# Patient Record
Sex: Male | Born: 1990 | Race: White | Hispanic: Yes | Marital: Single | Smoking: Never smoker
Health system: Southern US, Community
[De-identification: ages and names within clinical notes are randomized; demographics above are authoritative.]

---
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
## 2010-09-15 NOTE — Procedures (Signed)
DUPLEX DEEP VENOUS EXAM - LOWER EXTREMITY   INDICATION:  Right lower extremity calf swelling.   HISTORY:  Edema:  Right medial calf.  Trauma/Surgery:  Injury to right medial calf in soccer approximately 3  months ago.  Pain:  No.  PE:  No.  Previous DVT:  No.  Anticoagulants:  No.  Other:   DUPLEX EXAM:                CFV   SFV   PopV  PTV    GSV                R  L  R  L  R  L  R   L  R  L  Thrombosis    o  o  o     o     o      o  Spontaneous   +  +  +     +     +      +  Phasic        +  +  +     +     +      +  Augmentation  +  +  +     +     +      +  Compressible  +  +  +     +     +      +  Competent     +  +  +     +     +      +   Legend:  + - yes  o - no  p - partial  D - decreased    IMPRESSION:  1. No evidence of deep or superficial venous thrombosis in either the      right lower extremity or the left common femoral vein.  2. Evidence of nonvascularized heterogeneous structure measuring 0.88      cm x 2.17 cm noted in the right medial calf.        _____________________________  V. Charlena Cross, MD   AS/MEDQ  D:  12/12/2008  T:  12/12/2008  Job:  [id number]

## 2010-10-06 ENCOUNTER — Emergency Department (HOSPITAL_COMMUNITY)
Admission: EM | Admit: 2010-10-06 | Discharge: 2010-10-06 | Disposition: A | Discharge status: Home / Self Care | Payer: Self-pay | Attending: Emergency Medicine | Admitting: Emergency Medicine

## 2010-10-06 ENCOUNTER — Emergency Department (HOSPITAL_COMMUNITY): Payer: Self-pay

## 2010-10-06 DIAGNOSIS — R071 Chest pain on breathing: Secondary | ICD-10-CM | POA: Insufficient documentation

## 2010-10-06 DIAGNOSIS — Y93B3 Activity, free weights: Secondary | ICD-10-CM | POA: Insufficient documentation

## 2010-10-06 DIAGNOSIS — X503XXA Overexertion from repetitive movements, initial encounter: Secondary | ICD-10-CM | POA: Insufficient documentation

## 2014-04-21 ENCOUNTER — Emergency Department (HOSPITAL_COMMUNITY): Payer: Medicaid Other

## 2014-04-21 ENCOUNTER — Emergency Department (HOSPITAL_COMMUNITY)
Admission: EM | Admit: 2014-04-21 | Discharge: 2014-04-21 | Disposition: A | Discharge status: Home / Self Care | Payer: Medicaid Other | Attending: Emergency Medicine | Admitting: Emergency Medicine

## 2014-04-21 ENCOUNTER — Encounter (HOSPITAL_COMMUNITY): Payer: Self-pay | Admitting: Emergency Medicine

## 2014-04-21 DIAGNOSIS — R0789 Other chest pain: Secondary | ICD-10-CM

## 2014-04-21 DIAGNOSIS — Z79899 Other long term (current) drug therapy: Secondary | ICD-10-CM | POA: Insufficient documentation

## 2014-04-21 DIAGNOSIS — R079 Chest pain, unspecified: Secondary | ICD-10-CM

## 2014-04-21 LAB — CBC
HCT: 46.4 % (ref 39.0–52.0)
Hemoglobin: 15.4 g/dL (ref 13.0–17.0)
MCH: 29.3 pg (ref 26.0–34.0)
MCHC: 33.2 g/dL (ref 30.0–36.0)
MCV: 88.4 fL (ref 78.0–100.0)
Platelets: 272 10*3/uL (ref 150–400)
RBC: 5.25 MIL/uL (ref 4.22–5.81)
RDW: 13.1 % (ref 11.5–15.5)
WBC: 7.6 10*3/uL (ref 4.0–10.5)

## 2014-04-21 LAB — BASIC METABOLIC PANEL
ANION GAP: 13 (ref 5–15)
BUN: 13 mg/dL (ref 6–23)
CALCIUM: 9.6 mg/dL (ref 8.4–10.5)
CO2: 26 meq/L (ref 19–32)
CREATININE: 0.93 mg/dL (ref 0.50–1.35)
Chloride: 101 mEq/L (ref 96–112)
GFR calc non Af Amer: >90 mL/min (ref >90)
GLUCOSE: 94 mg/dL (ref 70–99)
POTASSIUM: 3.9 meq/L (ref 3.7–5.3)
SODIUM: 140 meq/L (ref 137–147)

## 2014-04-21 LAB — I-STAT TROPONIN, ED: TROPONIN I, POC: 0 ng/mL (ref 0.00–0.08)

## 2014-04-21 MED ORDER — IBUPROFEN 800 MG PO TABS
800.0000 mg | ORAL_TABLET | Freq: Once | ORAL | Status: AC
  Administered 2014-04-21: 800 mg via ORAL
  Filled 2014-04-21: qty 1

## 2014-04-21 MED ORDER — IBUPROFEN 800 MG PO TABS: 800.0000 mg | ORAL_TABLET | Freq: Four times a day (QID) | ORAL | Status: DC | PRN

## 2014-04-21 NOTE — ED Notes (Signed)
Per Patient: Pt report constant Chest Pressure since Wednesday. Reports pain started radiating to his arm on Saturday. Pain is worse when resting and lying. Patient has no health history. Ax4, NAD.

## 2014-04-21 NOTE — ED Provider Notes (Signed)
CSN: [id number]     Arrival date & time 04/21/14  0441 History   First MD Initiated Contact with Patient 04/21/14 [phone]     Chief Complaint  Patient presents with  . Chest Pain     (Consider location/radiation/quality/duration/timing/severity/associated sxs/prior Treatment) HPI Pt is a 23yo healthy male presenting to ED with c/o left sided chest pain that started Wednesday, 12/16 after he played soccer, denies falls or known injury.  States pain is constant, aching and sore "as if I just worked out" pain is 5/10 at worst, radiated down his left arm yesterday. Reports taking OTC pain medication w/o relief. Denies previous hx of CAD or FH or CAD.  Denies hx of PE or DVT. Denies SOB, fever, n/v/d.    History reviewed. No pertinent past medical history. History reviewed. No pertinent past surgical history. History reviewed. No pertinent family history. History  Substance Use Topics  . Smoking status: Never Smoker   . Smokeless tobacco: Not on file  . Alcohol Use: No    Review of Systems  Constitutional: Negative for fever, chills, diaphoresis and fatigue.  HENT: Negative for congestion.   Respiratory: Negative for cough, shortness of breath, wheezing and stridor.   Cardiovascular: Positive for chest pain ( left side). Negative for palpitations and leg swelling.  Gastrointestinal: Negative for nausea, vomiting, diarrhea and anal bleeding.  Musculoskeletal: Negative for back pain.  All other systems reviewed and are negative.     Allergies  Review of patient's allergies indicates no known allergies.  Home Medications   Prior to Admission medications   Medication Sig Start Date End Date Taking? Authorizing Provider  Omega-3 Fatty Acids (FISH OIL PO) Take 1 capsule by mouth daily.   Yes Historical Provider, MD  ibuprofen (ADVIL,MOTRIN) 800 MG tablet Take 1 tablet (800 mg total) by mouth every 6 (six) hours as needed. 04/21/14   Junius FinnerErin O'Malley, PA-C   BP 125/71 mmHg  Pulse 62   Resp 17  SpO2 100% Physical Exam  Constitutional: He appears well-developed and well-nourished.  Pt lying comfortably in exam bed, NAD.   HENT:  Head: Normocephalic and atraumatic.  Eyes: Conjunctivae are normal. No scleral icterus.  Neck: Normal range of motion.  Cardiovascular: Normal rate, regular rhythm and normal heart sounds.   Pulmonary/Chest: Effort normal and breath sounds normal. No respiratory distress. He has no wheezes. He has no rales. He exhibits tenderness.    No respiratory distress, able to speak in full sentences w/o difficulty. Lungs: CTAB.   Chest pain reproducible with palpation. No crepitus or flail chest.  Abdominal: Soft. Bowel sounds are normal. He exhibits no distension and no mass. There is no tenderness. There is no rebound and no guarding.  Musculoskeletal: Normal range of motion.  Neurological: He is alert.  Skin: Skin is warm and dry.  Chest wall: no ecchymosis, erythema or lesions  Nursing note and vitals reviewed.   ED Course  Procedures (including critical care time) Labs Review Labs Reviewed  CBC  BASIC METABOLIC PANEL  I-STAT TROPOININ, ED    Imaging Review Dg Chest 2 View  04/21/2014   CLINICAL DATA:  Acute onset of constant chest pressure and pain at the left arm. Initial encounter.  EXAM: CHEST  2 VIEW  COMPARISON:  Chest radiograph performed 10/06/2010  FINDINGS: The lungs are well-aerated and clear. There is no evidence of focal opacification, pleural effusion or pneumothorax.  The heart is normal in size; the mediastinal contour is within normal limits. No acute osseous  abnormalities are seen.  IMPRESSION: No acute cardiopulmonary process seen.   Electronically Signed   By: Roanna RaiderJeffery  Chang M.D.   On: 04/21/2014 06:25     EKG Interpretation   Date/Time:  Sunday April 21 2014 04:47:11 EST Ventricular Rate:  70 PR Interval:  165 QRS Duration: 89 QT Interval:  360 QTC Calculation: 388 R Axis:   94 Text Interpretation:  Sinus  rhythm Borderline right axis deviation q waves  in inf leads No significant change since last tracing Confirmed by Erroll Lunani,  Adeleke Ayokunle [phone]([id number]) on 04/21/2014 5:11:24 AM      MDM   Final diagnoses:  Left-sided chest wall pain    Pt c/o left sided chest pain. Pain reproducible on palpation.  Vitals: WNL, O2-100% on RA.  Doubt ACS or PE. No evidence of pneumonia. Will tx conservatively with ibuprofen. Advised to f/u with PCP in 1 week for recheck of symptoms if not improving. Return precautions provided. Pt verbalized understanding and agreement with tx plan.     Junius FinnerErin O'Malley, PA-C 04/21/14 [phone]  Tomasita CrumbleAdeleke Oni, MD 04/21/14 [phone]

## 2017-10-14 ENCOUNTER — Encounter (HOSPITAL_COMMUNITY): Payer: Self-pay | Admitting: Emergency Medicine

## 2017-10-14 ENCOUNTER — Other Ambulatory Visit: Payer: Self-pay

## 2017-10-14 ENCOUNTER — Emergency Department (HOSPITAL_COMMUNITY)
Admission: EM | Admit: 2017-10-14 | Discharge: 2017-10-15 | Disposition: A | Discharge status: Home / Self Care | Payer: Self-pay | Attending: Emergency Medicine | Admitting: Emergency Medicine

## 2017-10-14 DIAGNOSIS — R0789 Other chest pain: Secondary | ICD-10-CM | POA: Insufficient documentation

## 2017-10-14 NOTE — ED Triage Notes (Signed)
Report from [facility]> Pt reports L sided chest pain since Sunday.  Seen by PCP on Thursday and told to take Ibuprofen.  Tonight he drink some beer and was sitting in car at gas station and started breathing rapidly and having 8/10 L sided chest pain.  EMS administered ASA 324 mg which decreased pain to 2/10 before arrival.  Denies nausea and vomiting.

## 2017-10-15 ENCOUNTER — Emergency Department (HOSPITAL_COMMUNITY): Payer: Self-pay

## 2017-10-15 LAB — CBC
HEMATOCRIT: 45.7 % (ref 39.0–52.0)
Hemoglobin: 14.7 g/dL (ref 13.0–17.0)
MCH: 28.4 pg (ref 26.0–34.0)
MCHC: 32.2 g/dL (ref 30.0–36.0)
MCV: 88.4 fL (ref 78.0–100.0)
Platelets: 293 10*3/uL (ref 150–400)
RBC: 5.17 MIL/uL (ref 4.22–5.81)
RDW: 12.7 % (ref 11.5–15.5)
WBC: 7.1 10*3/uL (ref 4.0–10.5)

## 2017-10-15 LAB — BASIC METABOLIC PANEL
ANION GAP: 9 (ref 5–15)
BUN: 11 mg/dL (ref 6–20)
CALCIUM: 9.4 mg/dL (ref 8.9–10.3)
CO2: 24 mmol/L (ref 22–32)
Chloride: 106 mmol/L (ref 101–111)
Creatinine, Ser: 1.05 mg/dL (ref 0.61–1.24)
GFR calc Af Amer: >60 mL/min (ref >60)
Glucose, Bld: 113 mg/dL — ABNORMAL HIGH (ref 65–99)
POTASSIUM: 3.6 mmol/L (ref 3.5–5.1)
SODIUM: 139 mmol/L (ref 135–145)

## 2017-10-15 LAB — I-STAT TROPONIN, ED: TROPONIN I, POC: 0 ng/mL (ref 0.00–0.08)

## 2017-10-15 NOTE — ED Provider Notes (Signed)
[facility] EMERGENCY DEPARTMENT Provider Note   CSN: [id number] Arrival date & time: 10/14/17  2343     History   Chief Complaint Chief Complaint  Patient presents with  . Chest Pain  . Shortness of Breath    HPI Jeff Hickman is a 27 y.o. male.  The history is provided by the patient.  He complains of pain in the left side of his chest intermittently over the last 5 days.  Pain is dull and sometimes worse with deep breath, sometimes worse with certain movements.  When present, he rates pain at 5/10.  There is no associated dyspnea or nausea or diaphoresis.  He is a non-smoker and denies history of diabetes, hypertension, hyperlipidemia.  There is no family history of premature coronary atherosclerosis.  However, there is family history of diabetes and hypertension and is worried about both of those.  He has been taking ibuprofen, but has not noticed any improvement with that.  He had a prior episode of similar pain which was evaluated in an emergency department, but this episode is worse.  Of note, he is a Corporate investment banker.  History reviewed. No pertinent past medical history.  There are no active problems to display for this patient.   History reviewed. No pertinent surgical history.      Home Medications    Prior to Admission medications   Medication Sig Start Date End Date Taking? Authorizing Provider  ibuprofen (ADVIL,MOTRIN) 800 MG tablet Take 1 tablet (800 mg total) by mouth every 6 (six) hours as needed. 04/21/14   Lurene Shadow, PA-C  Omega-3 Fatty Acids (FISH OIL PO) Take 1 capsule by mouth daily.    [provider]    Family History No family history on file.  Social History Social History   Tobacco Use  . Smoking status: Never Smoker  . Smokeless tobacco: Never Used  Substance Use Topics  . Alcohol use: Yes  . Drug use: Not Currently     Allergies   Patient has no known allergies.   Review of  Systems Review of Systems  All other systems reviewed and are negative.    Physical Exam Updated Vital Signs BP 129/68 (BP Location: Right Arm)   Pulse 73   Temp 98.4 F (36.9 C) (Oral)   Resp 13   SpO2 100%   Physical Exam  Nursing note and vitals reviewed.  27 year old male, resting comfortably and in no acute distress. Vital signs are normal. Oxygen saturation is 100%, which is normal. Head is normocephalic and atraumatic. PERRLA, EOMI. Oropharynx is clear. Neck is nontender and supple without adenopathy or JVD. Back is nontender and there is no CVA tenderness. Lungs are clear without rales, wheezes, or rhonchi. Chest is mildly tender in the left anterolateral rib cage. Heart has regular rate and rhythm without murmur. Abdomen is soft, flat, nontender without masses or hepatosplenomegaly and peristalsis is normoactive. Extremities have no cyanosis or edema, full range of motion is present. Skin is warm and dry without rash. Neurologic: Mental status is normal, cranial nerves are intact, there are no motor or sensory deficits.  ED Treatments / Results  Labs (all labs ordered are listed, but only abnormal results are displayed) Labs Reviewed  BASIC METABOLIC PANEL - Abnormal; Notable for the following components:      Result Value   Glucose, Bld 113 (*)    All other components within normal limits  CBC  I-STAT TROPONIN, ED    EKG  EKG Interpretation  Date/Time:  Friday October 14 2017 23:49:51 EDT Ventricular Rate:  74 PR Interval:  156 QRS Duration: 90 QT Interval:  360 QTC Calculation: 399 R Axis:   83 Text Interpretation:  Normal sinus rhythm with sinus arrhythmia Normal ECG When compared with ECG of 04/21/2014, No significant change was found Confirmed by Dione BoozeGlick, Gargi Berch ([id number]) on 10/15/2017 12:00:25 AM   Radiology Dg Chest 2 View  Result Date: 10/15/2017 CLINICAL DATA:  Acute onset chest pain and tachypnea at gas station. EXAM: CHEST - 2 VIEW COMPARISON:   Chest radiograph April 21, 2014 FINDINGS: Cardiomediastinal silhouette is normal. No pleural effusions or focal consolidations. Trachea projects midline and there is no pneumothorax. Soft tissue planes and included osseous structures are non-suspicious. IMPRESSION: Negative. Electronically Signed   By: Awilda Metroourtnay  Bloomer M.D.   On: 10/15/2017 00:48    Procedures Procedures  Medications Ordered in ED Medications - No data to display   Initial Impression / Assessment and Plan / ED Course  I have reviewed the triage vital signs and the nursing notes.  Pertinent labs & imaging results that were available during my care of the patient were reviewed by me and considered in my medical decision making (see chart for details).  Chest pain which appears to be chest wall pain.  ECG is normal and chest x-ray is normal.  Troponin is normal.  Glucose is minimally elevated into the prediabetic range, but remainder of labs are normal.  Old records are reviewed, and he has a prior ED visit for chest wall pain in 2015.  Heart score is 0, which puts him at very low risk for major adverse cardiac events.  Patient is given reassurance regarding his work-up in the ED, and the apparent benign nature of his pain.  Advised to continue using ice and over-the-counter analgesics as needed.  Recommended outpatient monitoring of glucose level.  Return precautions discussed.  Final Clinical Impressions(s) / ED Diagnoses   Final diagnoses:  Chest wall pain    ED Discharge Orders    None       Dione BoozeGlick, Bryleigh Ottaway, MD 10/15/17 [phone]

## 2017-10-15 NOTE — ED Notes (Signed)
ED Provider at bedside. 

## 2018-03-22 ENCOUNTER — Encounter (INDEPENDENT_AMBULATORY_CARE_PROVIDER_SITE_OTHER): Payer: Self-pay | Admitting: Surgery

## 2018-03-22 ENCOUNTER — Ambulatory Visit (INDEPENDENT_AMBULATORY_CARE_PROVIDER_SITE_OTHER): Payer: Self-pay

## 2018-03-22 ENCOUNTER — Ambulatory Visit (INDEPENDENT_AMBULATORY_CARE_PROVIDER_SITE_OTHER): Payer: Self-pay | Admitting: Surgery

## 2018-03-22 VITALS — BP 128/80 | HR 65 | Temp 97.4°F | Ht 69.0 in | Wt 184.0 lb

## 2018-03-22 DIAGNOSIS — R079 Chest pain, unspecified: Secondary | ICD-10-CM

## 2018-03-22 DIAGNOSIS — M546 Pain in thoracic spine: Secondary | ICD-10-CM

## 2018-03-22 DIAGNOSIS — M549 Dorsalgia, unspecified: Secondary | ICD-10-CM

## 2018-03-22 NOTE — Progress Notes (Signed)
th

## 2018-03-23 NOTE — Progress Notes (Addendum)
Office Visit Note   Patient: Jeff Hickman           Date of Birth: [birth year]           MRN: [id number] Visit Date: 03/22/2018              Requested by: No referring provider defined for this encounter. PCP: System, Provider Not In   Assessment & Plan: Visit Diagnoses:  1. Upper back pain   2. Chest pain, unspecified type   3. Pain in thoracic spine     Plan: With patient's ongoing pain in his left thoracic and chest area and failed conservative treatment up to this point I recommend getting a thoracic MRI to rule out left-sided HNP.  I did speak with Dr. Prince RomeHilts about patient's chronic problem which includes some complaint of respiratory issues along with patient's previous diagnosis of atypical chest pain in the ER and urgent care.  If thoracic MRI is unremarkable I do think he needs further evaluation and referral to cardiologist and/or pulmonologist.  It may also be worthwhile to get CT of the chest but I will  leave that up to primary care physician.  No medications given today.  Dr. Jorge NyHills did speak with patient briefly.  Follow-Up Instructions: Return in about 3 weeks (around 04/12/2018) for with Dr Prince RomeHilts for review MRI.   Orders:  Orders Placed This Encounter  Procedures  . XR Thoracic Spine 2 View  . MR Thoracic Spine w/o contrast   No orders of the defined types were placed in this encounter.     Procedures: No procedures performed   Clinical Data: No additional findings.   Subjective: Chief Complaint  Patient presents with  . Middle Back - Pain    New Patient c/o upper back pain; left side  . Lower Back - Pain    HPI 27 year old Hispanic male is being seen with complaints of left upper back pain.  Patient has had this problem off and on for a few years.  Has seen multiple providers at urgent care and ER and diagnosed with  chest wall pain, and atypical chest pain.  He states that times his pain is associated with breathing.  Only on the left  side.  No symptoms on the right.  Was recently seen in the ER for this October 14, 2017 and then it looks like [facility] 17, [facility].  States that he gets stiffness in his left upper back and this pain can also be increased with activities.  Pain also extends around the left anterior rib area.  He has failed conservative treatment with chiropractor sessions, home exercise program, muscle relaxers, oral NSAIDs, prednisone taper.  Was also recently [facility] and given a prednisone taper and home exercise program about a month ago.  I do not have records from that visit for my review.  Patient states that he does not have a primary care physician currently. Review of Systems Patient admits to left upper back pain with some shallow and deep inspirations.  Denies GI or GU issues  Objective: Vital Signs: BP 128/80 (BP Location: Left Arm, Patient Position: Sitting, Cuff Size: Normal)   Pulse 65   Temp (!) 97.4 F (36.3 C) (Oral)   Ht 5\' 9"  (1.753 m)   Wt 184 lb (83.5 kg)   BMI 27.17 kg/m   Physical Exam  Constitutional: He is oriented to person, place, and  time. No distress.  HENT:  Head: Normocephalic and atraumatic.  Eyes: Pupils are equal, round, and reactive to light. EOM are normal.  Neck: Normal range of motion.  Pulmonary/Chest: No respiratory distress.  Neurological: He is alert and oriented to person, place, and time.  Skin: Skin is warm and dry.  Psychiatric: He has a normal mood and affect.    Ortho Exam  Specialty Comments:  No specialty comments available.  Imaging: No results found.   PMFS History: There are no active problems to display for this patient.  No past medical history on file.  No family history on file.  No past surgical history on file. Social History   Occupational History  . Not on file  Tobacco Use  . Smoking status: Never Smoker  . Smokeless tobacco: Never Used    Substance and Sexual Activity  . Alcohol use: Yes  . Drug use: Not Currently  . Sexual activity: Not on file

## 2018-04-15 ENCOUNTER — Ambulatory Visit
Admission: RE | Admit: 2018-04-15 | Discharge: 2018-04-15 | Disposition: A | Discharge status: Home / Self Care | Payer: Self-pay | Source: Ambulatory Visit | Attending: Surgery | Admitting: Surgery

## 2018-04-15 DIAGNOSIS — M546 Pain in thoracic spine: Secondary | ICD-10-CM

## 2018-04-20 ENCOUNTER — Telehealth (INDEPENDENT_AMBULATORY_CARE_PROVIDER_SITE_OTHER): Payer: Self-pay | Admitting: Family Medicine

## 2018-04-20 NOTE — Telephone Encounter (Signed)
Patient had an MRI on December 14th.  Per Fayrene FearingJames, patient was to follow up with Dr. Prince RomeHilts for an MRI review.  Patient wanted to know if Dr. Prince RomeHilts would call him with the results of the MRI.  CB#[phone].  Thank you.

## 2018-04-20 NOTE — Telephone Encounter (Signed)
Please advise (patient is aware that Dr. Prince RomeHilts is out of the office this afternoon).

## 2018-04-21 ENCOUNTER — Telehealth (INDEPENDENT_AMBULATORY_CARE_PROVIDER_SITE_OTHER): Payer: Self-pay | Admitting: Family Medicine

## 2018-04-21 NOTE — Telephone Encounter (Signed)
I spoke with the patient, advising him of the lab results.  He will go a couple more times to PT and see if that helps.  He will let us know if he fails to improve with this - may do some bloodwork to further evaluate his pain symptoms.

## 2018-04-21 NOTE — Telephone Encounter (Signed)
MRI looks good, no ruptured discs or pinched nerves.  His pain is most likely muscular.  If still hurting, I'd suggest physical therapy.  If that doesn't help, then we'll draw some labs.

## 2018-05-09 ENCOUNTER — Encounter (INDEPENDENT_AMBULATORY_CARE_PROVIDER_SITE_OTHER): Payer: Self-pay | Admitting: Family Medicine

## 2018-05-09 ENCOUNTER — Ambulatory Visit (INDEPENDENT_AMBULATORY_CARE_PROVIDER_SITE_OTHER): Payer: Self-pay | Admitting: Family Medicine

## 2018-05-09 DIAGNOSIS — R079 Chest pain, unspecified: Secondary | ICD-10-CM

## 2018-05-09 NOTE — Progress Notes (Signed)
   Office Visit Note   Patient: Jeff Hickman           Date of Birth: 12/02/1990           MRN: [id number] Visit Date: 05/09/2018 Requested by: No referring provider defined for this encounter. PCP: System, Provider Not In  Subjective: Chief Complaint  Patient presents with  . Middle Back - Pain, Follow-up    Continues to have a tight feeling in the back and neck.    HPI: Jeff Hickman is here for follow-up left sided chest wall and thoracic pain.  Since last visit Jeff Hickman has had an MRI scan which was unrevealing, no disc herniations in the thoracic spine.  Jeff Hickman went to physical therapy 3 times for dry needling which gave him some improvement in symptoms.  Jeff Hickman had some labs at an outside facility which were unremarkable per his report.  Jeff Hickman is taking vitamin D as recommended.  Jeff Hickman states that his pain is very manageable, but Jeff Hickman is just concerned that there still might be something going on that has not been discovered yet.  Jeff Hickman does note that a year ago Jeff Hickman weighed 220 pounds and decided to lose weight by eating more healthfully and exercising.  When Jeff Hickman lost almost 40 pounds, Jeff Hickman started having this pain.  Jeff Hickman wonders whether it might be related.              ROS: Noncontributory  Objective: Vital Signs: There were no vitals taken for this visit.  Physical Exam:  Chest: Lungs are clear to auscultation throughout.  Heart has regular rate and rhythm without murmurs, rubs, or gallops. Back: Jeff Hickman has multiple tender trigger points in the rhomboid area on the left side.  Imaging: None today.  Assessment & Plan: 1.  Left-sided chest wall and thoracic pain, suspect myofascial pain.  Could be related to rapid weight loss. -Jeff Hickman will try core strengthening using a stability ball.  If symptoms persist we will try a food elimination diet.  If still no improvement, then CT scan of chest.   Follow-Up Instructions: No follow-ups on file.      Procedures: No procedures performed  No notes on file    PMFS  History: There are no active problems to display for this patient.  History reviewed. No pertinent past medical history.  History reviewed. No pertinent family history.  History reviewed. No pertinent surgical history. Social History   Occupational History  . Not on file  Tobacco Use  . Smoking status: Never Smoker  . Smokeless tobacco: Never Used  Substance and Sexual Activity  . Alcohol use: Yes  . Drug use: Not Currently  . Sexual activity: Not on file

## 2019-01-01 ENCOUNTER — Encounter: Payer: Self-pay | Admitting: Gastroenterology

## 2019-01-30 ENCOUNTER — Other Ambulatory Visit: Payer: Self-pay

## 2019-01-30 ENCOUNTER — Ambulatory Visit: Payer: Self-pay | Admitting: Gastroenterology

## 2019-01-30 ENCOUNTER — Encounter: Payer: Self-pay | Admitting: Gastroenterology

## 2019-01-30 VITALS — BP 110/74 | Temp 98.2°F | Ht 69.0 in | Wt 196.0 lb

## 2019-01-30 DIAGNOSIS — K921 Melena: Secondary | ICD-10-CM

## 2019-01-30 NOTE — Patient Instructions (Signed)
Please start taking Citrucel(orange flavored) powder supplement.This may cause some bloating at first,but that usually goes away.Begin with a small spoonful and work your way up to a large,heaping spoonful daily over a week  Call our office in one month to report how you are doing. Call sooner if you see blood in your stool or from rectum.  Thank you for entrusting me with your care and choosing [facility].  Dr Ardis Hughs

## 2019-01-30 NOTE — Progress Notes (Signed)
HPI: This is a very pleasant 28 year old man who was referred to me by Everardo Beals, NP  to evaluate minor rectal bleeding, change in bowel habits.    Chief complaint is minor rectal bleeding, change in bowel habits  He saw blood in his stool small amount with a bowel movement about 1 month ago.  This is the only time he is ever seen.  He has not seen it since.  No family history of colon cancer.  He has not been having any significant abdominal pains but he does have bloating at times.  He has noted a slight change in his bowels where they can be more bulky more thin just very irregular.  His weight is overall stable.   Review of systems: Pertinent positive and negative review of systems were noted in the above HPI section. All other review negative.   History reviewed. No pertinent past medical history.  History reviewed. No pertinent surgical history.  Current Outpatient Medications  Medication Sig Dispense Refill  . Omega-3 Fatty Acids (FISH OIL PO) Take 1 capsule by mouth daily.     No current facility-administered medications for this visit.     Allergies as of 01/30/2019  . (No Known Allergies)    Family History  Problem Relation Age of Onset  . Throat cancer Maternal Grandmother   . Stomach cancer Maternal Uncle   . Colon cancer Neg Hx   . Esophageal cancer Neg Hx     Social History   Socioeconomic History  . Marital status: Single    Spouse name: Not on file  . Number of children: Not on file  . Years of education: Not on file  . Highest education level: Not on file  Occupational History  . Not on file  Social Needs  . Financial resource strain: Not on file  . Food insecurity    Worry: Not on file    Inability: Not on file  . Transportation needs    Medical: Not on file    Non-medical: Not on file  Tobacco Use  . Smoking status: Never Smoker  . Smokeless tobacco: Never Used  Substance and Sexual Activity  . Alcohol use: Yes    Alcohol/week:  5.0 standard drinks    Types: 5 Cans of beer per week    Comment: weekly  . Drug use: Not Currently  . Sexual activity: Not on file  Lifestyle  . Physical activity    Days per week: Not on file    Minutes per session: Not on file  . Stress: Not on file  Relationships  . Social Herbalist on phone: Not on file    Gets together: Not on file    Attends religious service: Not on file    Active member of club or organization: Not on file    Attends meetings of clubs or organizations: Not on file    Relationship status: Not on file  . Intimate partner violence    Fear of current or ex partner: Not on file    Emotionally abused: Not on file    Physically abused: Not on file    Forced sexual activity: Not on file  Other Topics Concern  . Not on file  Social History Narrative  . Not on file     Physical Exam: BP 110/74   Temp 98.2 F (36.8 C)   Ht 5\' 9"  (1.753 m)   Wt 196 lb (88.9 kg)   BMI 28.94 kg/m  Constitutional: generally well-appearing Psychiatric: alert and oriented x3 Eyes: extraocular movements intact Mouth: oral pharynx moist, no lesions Neck: supple no lymphadenopathy Cardiovascular: heart regular rate and rhythm Lungs: clear to auscultation bilaterally Abdomen: soft, nontender, nondistended, no obvious ascites, no peritoneal signs, normal bowel sounds Extremities: no lower extremity edema bilaterally Skin: no lesions on visible extremities Rectal examination: No external anal hemorrhoids, no anal fissures, digital rectal exam to about 5 cm was normal.  Stool was brown and not checked for Hemoccult  Assessment and plan: 28 y.o. male with minor rectal bleeding, minor change in bowel habits  First half of the colonoscopy to exclude anything significant.  He would be painless out-of-pocket and so was a bit concerned.  I told him in that instance it is certainly reasonable to simply see how he does over time.  He will start taking fiber supplements on a  daily basis to see if that evens out his mild bowel irregularities.  He he knows to call to report on his symptoms in 4 weeks and sooner if he sees bleeding again.   Please see the "Patient Instructions" section for addition details about the plan.   Rob Bunting, MD [facility] 01/30/2019, 1:58 PM  Cc: Marva Panda, NP

## 2020-05-26 IMAGING — MR MR THORACIC SPINE W/O CM
4 of 6 series · 23 of 48 positions shown · non-contrast
Comparison: Radiographs 03/22/2018

CLINICAL DATA: 27-year-old with left neck and mid back tightness
and tenderness radiating to the anterior chest for 5 months. No
acute injury or prior relevant surgery.

EXAM:
MRI THORACIC SPINE WITHOUT CONTRAST
TECHNIQUE: Multiplanar, multisequence MR imaging of the thoracic spine was
performed. No intravenous contrast was administered.

[Series 17: T1 · sagittal · 3.0mm · 0.92mm/px · 5 of 15 slices shown]
[im 1/15]
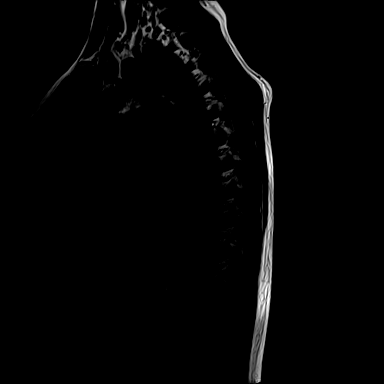
[im 4/15]
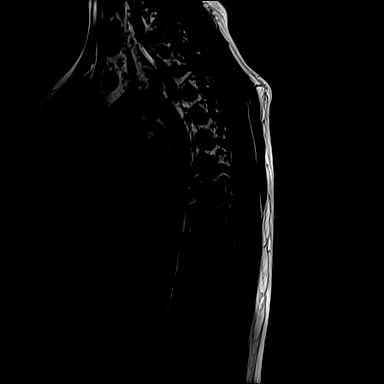
[im 8/15]
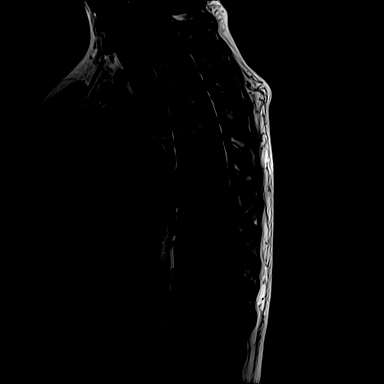
[im 11/15]
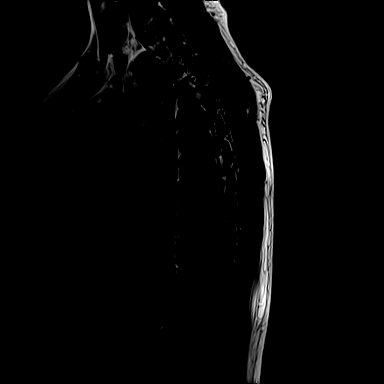
[im 15/15]
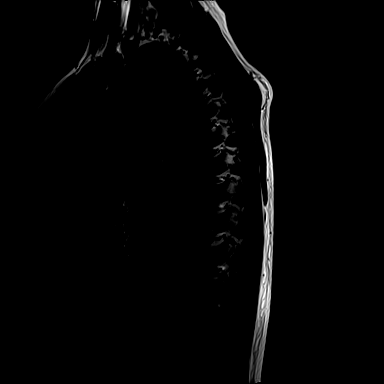

[Series 18: STIR · sagittal · 3.0mm · 1.11mm/px · 5 of 15 slices shown]
[im 1/15]
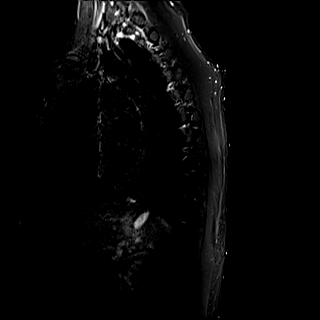
[im 4/15]
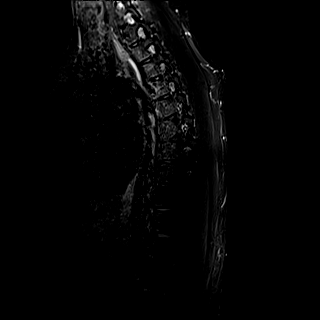
[im 8/15]
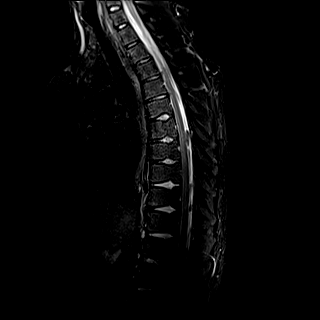
[im 11/15]
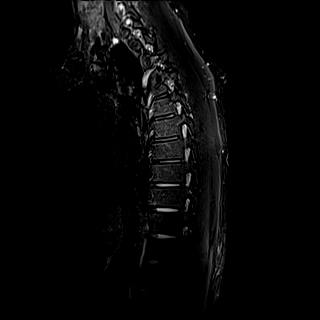
[im 15/15]
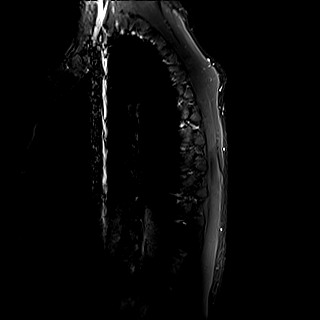

[Series 19: T2 · sagittal · 3.0mm · 0.92mm/px · 5 of 15 slices shown (1 of 2)]
[im 1/15]
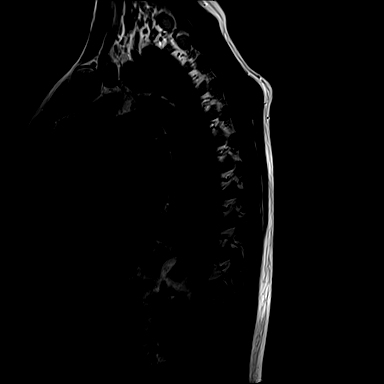
[im 4/15]
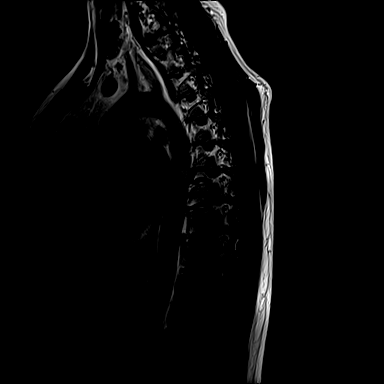
[im 8/15]
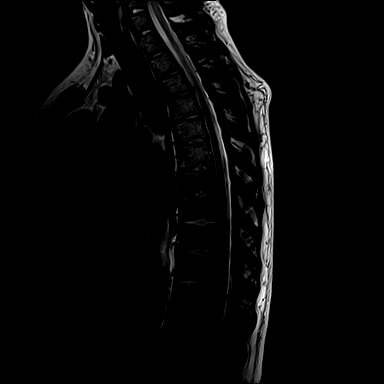
[im 11/15]
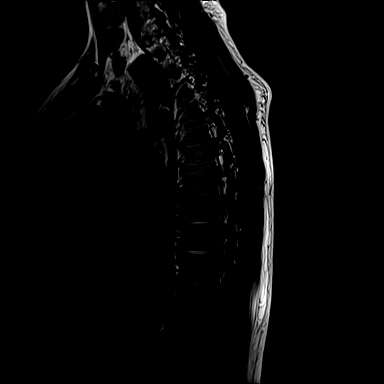
[im 15/15]
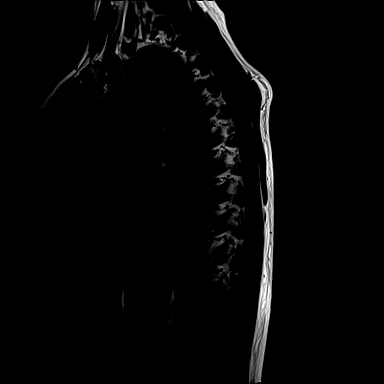

[Series 20: T2 · axial · 4.0mm · 0.28mm/px · z∈[-298,-49]mm · 8 of 39 slices shown (2 of 2)]
[im 1/39]
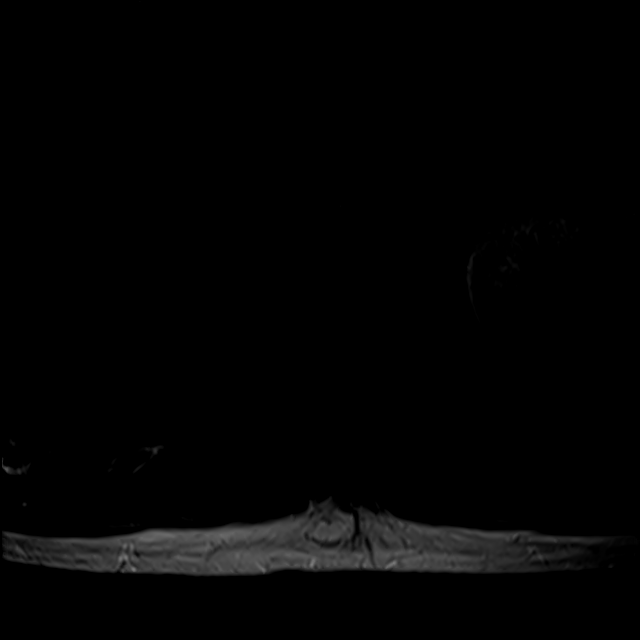
[im 6/39]
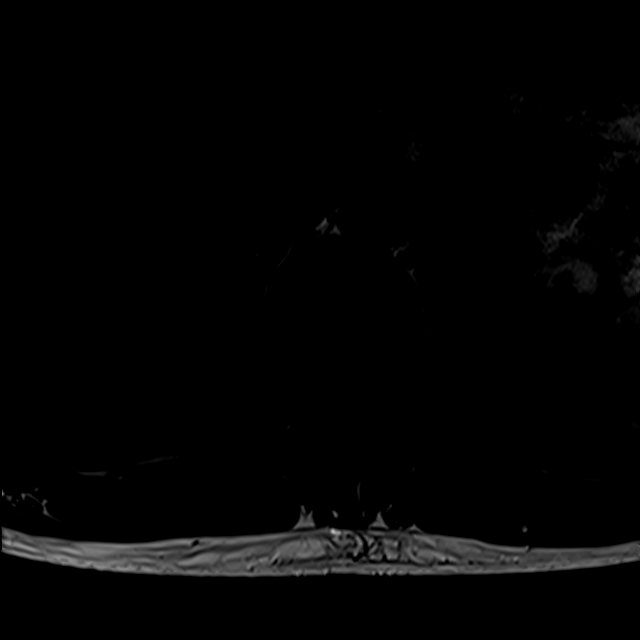
[im 12/39]
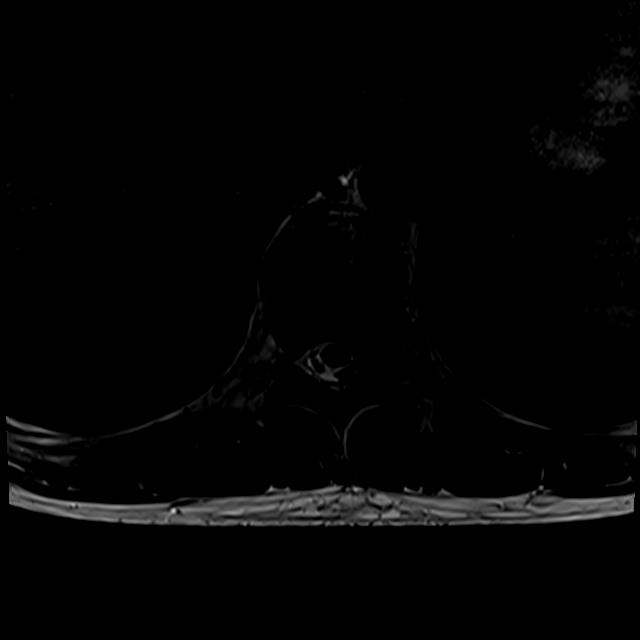
[im 18/39]
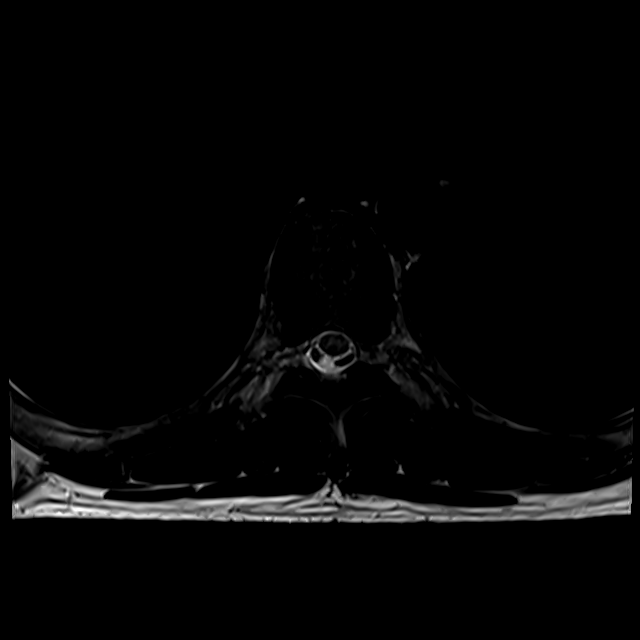
[im 21/39]
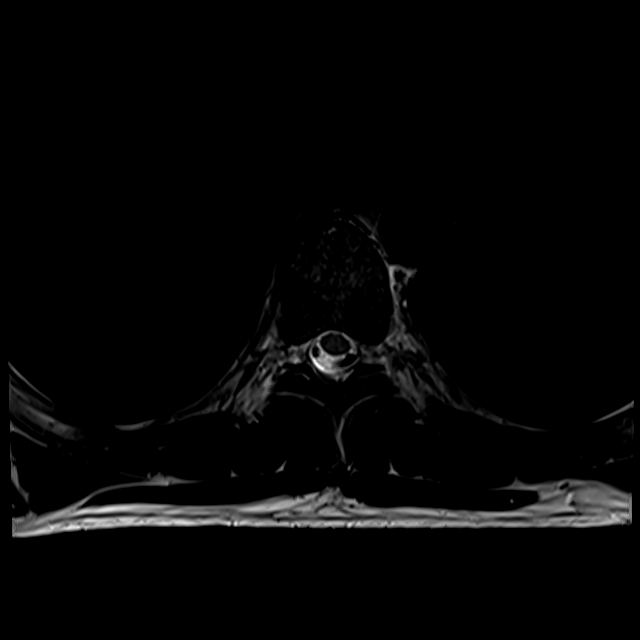
[im 27/39]
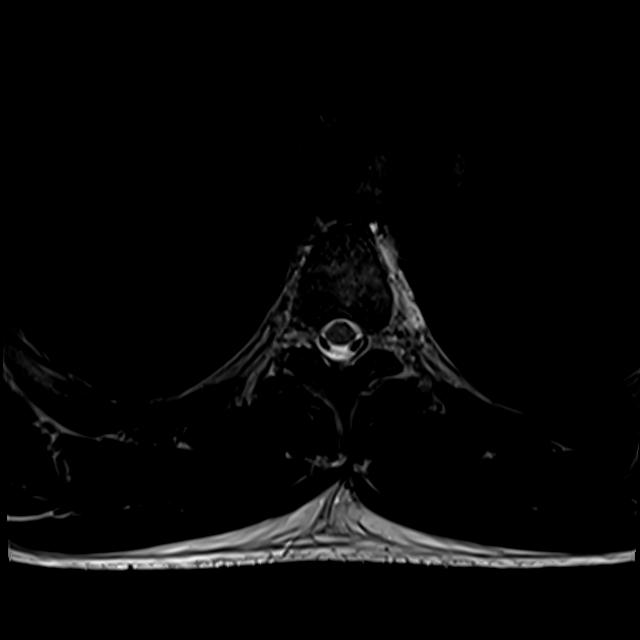
[im 33/39]
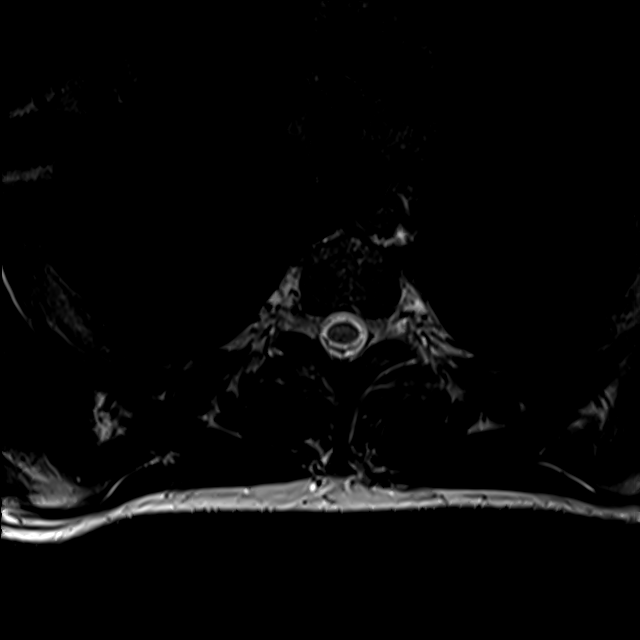
[im 39/39]
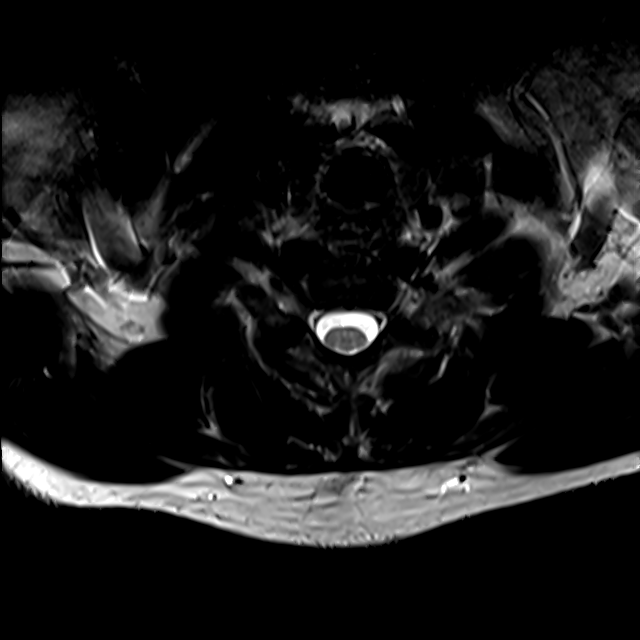

[23 of 48 positions shown; findings below may reference images not displayed]

FINDINGS: Alignment:  Normal without significant kyphosis.

Vertebrae: 12 rib-bearing thoracic type vertebral bodies. There are
multiple small Schmorl's nodes throughout the thoracic spine,
extending from T5-6 through T11-12. No associated endplate edema or
anterior vertebral body wedging. No acute osseous findings.

Cord: Normal in signal and caliber. Conus extends to approximately
L1, not well visualized.

Paraspinal and other soft tissues: Unremarkable.

Disc levels:

There is no disc herniation, spinal stenosis or nerve root
encroachment.
IMPRESSION: 1. Multiple small chronic appearing Schmorl's nodes in the thoracic
spine without vertebral body wedging or kyphosis to suggest
Scheuermann's disease. No acute osseous findings.
2. No disc herniation, spinal stenosis or nerve root encroachment.
3. The thoracic cord appears normal.
